# Patient Record
Sex: Male | Born: 1955 | Race: White | Hispanic: No | Marital: Married
Health system: Western US, Academic
[De-identification: ages and names within clinical notes are randomized; demographics above are authoritative.]

---

## 2021-08-16 ENCOUNTER — Ambulatory Visit: Admit: 2021-08-16 | Payer: BLUE CROSS/BLUE SHIELD | Attending: Physician

## 2021-08-16 DIAGNOSIS — S42021A Displaced fracture of shaft of right clavicle, initial encounter for closed fracture: Secondary | ICD-10-CM

## 2021-08-16 NOTE — Patient Instructions (Addendum)
Practice Assistant:  Farris Has  Tel: [phone]  Fax: [phone]  [email or web address]    COLLARBONE/CLAVICLE FRACTURES  (Adapted from [email or web address])    A broken collarbone is also known as a clavicle fracture. This is a very common fracture that occurs in people of all ages.    Anatomy  The collarbone (clavicle) is located between the ribcage (sternum) and the shoulder blade (scapula), and it connects the arm to the body.  The clavicle lies above several important nerves and blood vessels. However, these vital structures are rarely injured when the clavicle breaks, even though the bone ends can shift when they are fractured.      Description  The clavicle is a long bone and most breaks occur in the middle of it. Occasionally, the bone will break where it attaches at the ribcage or shoulder blade.    Cause  Clavicle fractures are often caused by a direct blow to the shoulder. This can happen during a fall onto the shoulder or a car collision. A fall onto an outstretched arm can also cause a clavicle fracture. In babies, these fractures can occur during the passage through the birth canal.  \  Symptoms  Clavicle fractures can be very painful and may make it hard to move your arm. Additional symptoms include:   Sagging shoulder (down and forward)   Inability to lift the arm because of pain   A grinding sensation if an attempt is made to raise the arm   A deformity or "bump" over the break   Bruising, swelling, and/or tenderness over the collarbone    Doctor Examination  During the evaluation, your doctor will ask questions about the injury and how it occurred. After discussing the injury and your symptoms, your doctor will examine your shoulder.    There is usually an obvious deformity, or "bump," at the fracture site. Gentle pressure over the break will bring about pain. Although a fragment of bone rarely breaks through the skin, it may push the skin into a "tent" formation.  Your doctor will carefully examine  your shoulder to make sure that no nerves or blood vessels were damaged.  In order to pinpoint the location and severity of the break, your doctor will order an x-ray. X-rays of the entire shoulder will often be done to check for additional injuries. If other bones are broken, your doctor may order a computed tomography (CT or CAT) scan to see the fractures in better detail.    Nonsurgical Treatment  If the broken ends of the bones have not shifted out of place and line up correctly, you may not need surgery. Broken collarbones can heal without surgery.  Arm Support  A simple arm sling or figure-of-eight wrap is usually used for comfort immediately after the break. These are worn to support your arm and help keep it in position while it heals.  Medication  Pain medication, including acetaminophen, can help relieve pain as the fracture heals.  Physical Therapy  While you are wearing the sling, you will likely lose muscle strength in your shoulder. Once your bone begins to heal, the pain will decrease and your doctor may start gentle shoulder and elbow exercises. These exercises will help prevent stiffness and weakness. More strenuous exercises can gradually be started once the fracture is completely healed.  Doctor Follow-Up  You will need to see your doctor regularly until your fracture heals. He or she will examine you and take x-rays to make sure the bone is healing  in good position. After the bone has healed, you will be able to gradually return to your normal activities.  Complications  The fracture can move out of place before it heals. It is important to follow up with your doctor as scheduled to make sure the bone stays in position.  If the fracture fragments do move out of place and the bones heal in that position, it is called a "malunion." Treatment for this is determined by how far out of place the bones are and how much this affects your arm movement.  A large bump over the fracture site may develop as  the fracture heals. This usually gets smaller over time, but a small bump may remain permanently.    Surgical Treatment  If your bones are out of place by more than 2 cm  (displaced) or broken in to many pieces, your doctor may recommend surgery. Surgery can align the bones exactly and hold them in good position while they heal. This can improve shoulder strength when you have recovered.    Plates and Screws  During this operation, the bone fragments are first repositioned into their normal alignment, and then held in place with special screws and/or by attaching metal plates to the outer surface of the bone.  After surgery, you may notice a small patch of numb skin below the incision. This numbness will become less noticeable with time. Because there is not a lot of fat over the collarbone, you may be able to feel the plate through your skin.  Plates and screws are usually not removed after the bone has healed, unless they are causing discomfort. Problems with the hardware are not common, but sometimes, seatbelts and backpacks can irritate the collarbone area. If this happens, the hardware can be removed after the fracture has healed.  Specific exercises will help restore movement and strengthen your shoulder. Your doctor may provide you with a home therapy plan or suggest that you work with a physical therapist.  Therapy programs typically start with gentle motion exercises. Your doctor will gradually add strengthening exercises to your program as your fracture heals.  Although it is a slow process, following your physical therapy plan is an important factor in returning to all the activities you enjoy.  Surgical Complications  People who use any kind of tobacco product, have diabetes, or are elderly are at a higher risk for complications during and after surgery. They are also more likely to have problems with wound and bone healing. Be sure to talk with your doctor about the risks and benefits of surgery for  your clavicle fracture.  There are risks associated with any surgery, including:   Infection   Bleeding   Pain   Blood clots in your leg   Damage to blood vessels or nerves   Nausea    The risks specific to surgery for collarbone fractures include:   Difficulty with bone healing   Lung injury   Hardware irritation      Outcome  Whether your treatment involves surgery or not, it can take several months for your collarbone to heal. It may take longer in diabetics or people who smoke or chew tobacco.  Most people return to regular activities within 3 months of their injury. Your doctor will tell you when your injury is stable enough to do so. Returning to regular activities or lifting with your arm before your doctor advises may cause your fracture fragments to move or your hardware to break. This  may require you to start your treatment from the beginning.  Once your fracture has completely healed, you can safely return to sports activities.   Preparing for Surgery at [facility] ([facility])                              Your surgeon has recommended that you have an operation.  The following instructions are designed to take you step-by-step through the process and to answer some frequently asked questions.    When am I having surgery and where do I go?  Your surgeon's office will provide you with the location, date, and time for your operation.  If you do not hear from your surgeon's office and want to check on the status of your upcoming procedure, please call the surgeon's office and ask to speak with his/her practice assistant.     Will I meet my anesthesiologist before surgery?  You will not meet the anesthesiologist assigned to take care of you until the day of surgery.  However, you will be assessed by one of [facility] nurse practitioners prior to your operation.  For some patients, this assessment will take place over the phone.  For other patients, an in-person evaluation will be required in our  PREPARE clinic.      What do I need to do now?  You will receive a call from your surgeons office before your surgery to set up either a phone consult or in-person PREPARE clinic appointment.  If the phone consult or in-person PREPARE clinic appointment scheduled does not work, you may call the [facility] at [phone] to reschedule.  If you do not receive a call from the PREPARE clinic, please call your surgeon's office to confirm that your operation has been scheduled and that the referral to PREPARE made.    Do I need to have any tests done prior to surgery?  Your surgeon may require laboratory or other diagnostics tests prior to surgery.  These are printed in your After Visit Summary from your surgery clinic visit, and your surgeon may have also given you printed requisition forms.    If you are seen in-person in the [facility], your tests will be performed during your PREPARE appointment.    If you are evaluated by phone, a member of the PREPARE team will give you explicit instructions on when and where to have these studies done.      For many operations, laboratory tests, EKGs and chest x-rays are not needed.      If you are a Jehovah's Witness, you must notify your surgeon in advance of your procedure. In addition, you MUST discuss your desires regarding transfusion of blood products with your surgeon or the Nurse Practitioner from the [facility].  If you have a bleeding disorder such as von Willebrand's disease or hemophilia, notify your surgeon in advance as special arrangements may need to be made in preparation for your surgery (for example, referral to a hematologist).    Will [facility] review my health information from other places?  In order to plan for a smooth operation and recovery, we frequently need to review records from your other doctors.  If you have had any of the studies listed below, please arrange to have the reports faxed to the [facility] clinic.  Delays in  obtaining these records may lead to a delay in your upcoming procedure.    Please Fax the following  applicable records to [facility] at [phone].  1. Recent notes from your primary care provider  2. Recent blood work (within 6 months)  3. Stress test  4. Echocardiogram (Echo)   5. Electrocardiogram (EKG)  6. Cardiac catheterization   7. Clinic notes from any specialist who has evaluated you in the past 2 years (for example a cardiologist, pulmonologist, hematologist)    Which medications should I stop or start prior to surgery?  Our PREPARE nurse practitioner will give you detailed instructions on how to manage your medications prior to surgery.     Where do I go on the day of surgery?  And when?  [facility] will give you instructions on when to arrive and where to go on the day of surgery.  You will also receive a call from [facility] one to two days before surgery to confirm arrival time.      Where is the [facility] clinic?  [facility] is located inside [facility] at [address] in [place].  Public parking is available in the lot behind the building charged at $5 for three hours paid at a kiosk.  Handicapped parking is $5 flat fee with placard on the dashboard.    Does my surgeon have any special instructions for me?  If your surgeon has specific instructions, they are listed below.
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
## 2021-08-19 ENCOUNTER — Ambulatory Visit: Admit: 2021-08-19 | Discharge: 2021-08-19 | Payer: BLUE CROSS/BLUE SHIELD

## 2021-08-19 DIAGNOSIS — S42021A Displaced fracture of shaft of right clavicle, initial encounter for closed fracture: Secondary | ICD-10-CM

## 2021-08-20 ENCOUNTER — Ambulatory Visit: Admit: 2021-08-21 | Payer: BLUE CROSS/BLUE SHIELD | Attending: Physician Assistant

## 2021-08-20 DIAGNOSIS — S42021A Displaced fracture of shaft of right clavicle, initial encounter for closed fracture: Secondary | ICD-10-CM

## 2021-08-20 LAB — COVID-19 RNA, RT-PCR/NUCLEIC ACID AMPLIFICATION: COVID-19 RNA, RT-PCR/Nucleic Acid Amplification: NOT DETECTED

## 2021-08-20 MED ORDER — NALOXONE 4 MG/ACTUATION NASAL SPRAY
4 mg/actuation | Freq: Once | NASAL | 0 refills | Status: DC | PRN
Start: 2021-08-20 — End: 2022-05-19

## 2021-08-20 MED ORDER — OXYCODONE-ACETAMINOPHEN 5 MG-325 MG TABLET
5-325 | ORAL_TABLET | ORAL | 0 refills | 30.00000 days | Status: AC | PRN
Start: 2021-08-20 — End: 2021-10-25

## 2021-08-20 MED ORDER — DOCUSATE SODIUM 250 MG CAPSULE
250 mg | ORAL_CAPSULE | Freq: Two times a day (BID) | ORAL | 2 refills | Status: AC | PRN
Start: 2021-08-20 — End: 2021-10-25

## 2021-08-20 MED ORDER — IBUPROFEN 600 MG TABLET
600 | ORAL_TABLET | Freq: Four times a day (QID) | ORAL | 1 refills | Status: DC | PRN
Start: 2021-08-20 — End: 2021-10-25

## 2021-08-20 MED ORDER — TRULICITY 1.5 MG/0.5 ML SUBCUTANEOUS PEN INJECTOR
1.5 | SUBCUTANEOUS | 2.00 refills | Status: DC
Start: 2021-08-20 — End: 2023-04-24

## 2021-08-20 MED ORDER — ONDANSETRON HCL 4 MG TABLET
4 | ORAL_TABLET | Freq: Three times a day (TID) | ORAL | 2 refills | Status: DC | PRN
Start: 2021-08-20 — End: 2021-10-25

## 2021-08-20 NOTE — H&P (View-Only) (Signed)
[facility]  Anesthesia Preprocedure Evaluation    Procedure Information     Case: [id number] Date/Time: 08/22/21 0730    Procedure: CLAVICLE OPEN REDUCTION INTERNAL FIXATION (ORIF) (Right )    Diagnosis: Displaced fracture of shaft of right clavicle, initial encounter for closed fracture [S42.021A]    Pre-op diagnosis: Displaced fracture of shaft of right clavicle, initial encounter for closed fracture [S42.021A]    Location: [place] [facility] OR 04 /  [facility]    Surgeons: Eyvonne Mechanic, MD          Precautions          None          Relevant Problems   No relevant active problems         Anesthesia Encounter History        CC/HPI/Past Medical History Summary: Alvin Barber is a 65 y.o. male     #Insulin dependent diabetic - on insulin, metformin; last dose of lantis last night, took 1 u Humalog this AM for glucose 163 at home     (Please refer to APeX Allergies, Problems, Past Medical History, Past Surgical History, Social History, and Family History activities, Results for current data from these respective sections of the chart; these sections of the chart are also summarized in reports, including the Patient Summary Extracts found in Chart Review)        Summary of Prior Anesthetics: Previous anesthetic without AAC          Review of Systems Functional Status: Participate in moderate recreation activies, such as golfs, bowling, dancing, or throwing a ball (6.00 METs)   Constitutional: Negative for chills, diaphoresis, fatigue, fever and unexpected weight change.   Airway: Positive for snoring. Negative for limited neck movement, difficulty opening mouth, loose teeth, dental hardware, TMJ problem, witnessed apnea and daytime somnolence       Denies OSA  HENT: Negative for congestion, sinus pressure and trouble swallowing.   Eyes: Positive for visual disturbance.        Glasses   Respiratory: Negative for chest tightness, cough, Recent URI Symptoms, shortness of breath and wheezing.          Denies asthma   Cardiovascular: Negative for orthopnea, palpitations and chest pain.        Denies cv dz   Gastrointestinal: Positive for GERD Symptoms (controlled with prilosec and diet). Negative for abdominal distention, abdominal pain, anal bleeding, nausea and vomiting.        Deneis liver dz   Genitourinary: Negative for difficulty urinating, dysuria and hematuria.        Denies kidney dz   Musculoskeletal: Positive for arthralgias. Negative for neck pain and back pain.        Able to lie flat   Skin: Negative for rash and wound.   Neurological: Negative for headaches, syncope, seizures and dizziness.   Hematological: Does not bruise/bleed easily.        Latent adult onset diabetes. Uses lantus 22 units at night    FBG usually 110. No recent highs or lows. humalog on SS. avg about 10-12 units a day. Recent A1c 6.7   Psychiatric/Behavioral/Developmental: Negative.      Ht 188 cm (6\' 2" )   Wt 80.7 kg (178 lb)   BMI 22.85 kg/m     Physical Exam    Airway:   Modified Mallampati score: I. Thyromental distance: > 6.5 cm. Mouth opening: good. Neck range of motion: full.   Constitutional:  Appearance: He is well-developed and well-nourished.   Cardiovascular:      Rate and Rhythm: Normal rate and regular rhythm.   Pulmonary:      Effort: Pulmonary effort is normal.   Neurological:   Level of consciousness: alert.     Mental Status: He is oriented to person, place, and time.         Prepare (Pre-Operative Clinic) Assessment/Plan/Narrative  Prepare Clinic consult type: Telephone  CHG needed for pre-surgical prep, pt given instructions via AVS.  NPO instructions discussed and understood by patient.     AVS discussed, understood by pt., and sent via my chart    Instructed pt to not take Ibuprofen, ASA, MVI or other herbal supplements. Tylenol okay if needed.    Hold metformin DOS      On the day before surgery/procedure    Decrease glargine (Lantus) to 80% of the usual dose, to a temporary dose of 18 units on the  night before your surgery/procedure.  On the day of surgery/procedure    1. Check your glucose every 4 hours while you are awake.  2. Based on those glucose levels, give yourself Rapid - lispro (Humalog, Admelog, Lyumjev) Insulin starting in the morning.     Total Daily Insulin Dose (MDI)  Glucose  You currently take a total of 30 Units/day or less  You currently take more than a total of 30 Units/day  160-200   1 Units  2 Units  201-250   2 Units  3 Units  >250   3 Units  4 Units    You may notice that these short acting insulin correction doses may be different from what you normally use. In the short time around surgery, it is important to avoid potentially dangerous low glucoses (and avoid high glucoses that may increase risk of infection.)  Thus a glucose range a bit higher than you may be used to  on a daily basis may be safer and more appropriate.  You may be given separate instructions that you should not eat or drink anything after midnight, the night before your procedure/surgery. If you have a blood sugar below 70 after the time you are no longer supposed to take anything by mouth, you may safely treat the low sugar with either glucagon (injection or intranasal), Glucogel, or one tablespoon of granulated sugar placed under the tongue or in the cheek area. Up until 4 hours before your surgery/procedure, you may treat your low blood sugar with 4 ounces (1/2 cup) of clear juice (no pulp), or 4 ounces (1/2 cup) of sugar containing clear soda (not a diet drink)        These are suggestions. You must take into account your current clinical condition. If you have any questions, please contact your primary care physician, endocrinologist or surgeon. If you have a medical emergency, please go to the nearest emergency room or call 911 for assistance.      Obstructive Sleep Apnea Screening  STOPBANG Score:      S Do you Snore loudly (louder than talking or loud  enough to be heard through closed doors)? No    T Do  you often feel Tired, fatigued, or sleepy during daytime? No    O Has anyone Observed you stop breathing during  your sleep? No    P Do you have or are you being treated for high blood Pressure? No    B BMI more than 35 KG/m^2? No    A  Age over 61 years old? Yes    N Neck circumference > 16 inches (40cm)?     G Gender: Male? Yes    STOPBANG Total Score 2         Risk Level (based only on STOPBANG) Low - Incomplete       CPAP/BiPAP prescribed - No.              Anesthesia Assessment and Plan  Day Of Surgery Provider Chart Review:  NPO status verified  Medications reviewed  Allergies reviewed  Problem list reviewed  Anesthesia history reviewed  Pertinent labs reviewed  Consults reviewed    ASA 2       Anesthesia Plan  Anesthesia Type: general and regionalRegional Type: interscalene   Induction Technique: IntraVenous  Invasive Monitors/Vascular Access: none  Airway Plan: supraglottic airway  Possible Advanced Airway Techniques: None  Other Techniques: None  Planned Recovery Location: PACU    Blood Product PreparationBlood Products Plan: N/A, minimal risk    Anesthesia Potential Complication Discussion  There is the possibility of rare but serious complications.    Informed Consent for Anesthesia  Consent obtained from patient    Risks, benefits and alternatives including those of invasive monitoring discussed. Increased risks (as above) discussed.  Questions invited and all answered.  Interpreter: N/A - patient/guardian's preferred language is [place].  Regional Anesthesia Consent:  As part of obtaining consent for regional anesthesia the following was discussed with the patient:  1. The role of the nerve block within the overall anesthetic plan and alternative analgesic approaches  2. How the block will be performed  3. The risk of bleeding and infection  4. The risk of nerve injury within a spectrum of mild/temporary to permanent/sever neurological deficit  5. The likely effects of the nerve block, specifically    A.  Extent of sensory block    B. Potential motor block    C. Potential side effects    D. Duration of action    Consent granted for anesthetic plan    Quality Measure Documentation   Opioid Therapy Planned? Yes    (See Anesthesia Record for attending attestation)    [Please note, smart link data included in this note may not reflect changes since note creation. Please see appropriate section of APeX for up-to-the minute information.]

## 2021-08-20 NOTE — Patient Instructions (Signed)
We want your upcoming surgical visit to be as safe and comfortable as possible. The following instructions are designed to prepare you for your surgical hospitalization.  Please read and follow all instructions carefully.    Procedure Location  Your procedure is scheduled to take place at [facility] [address] 2nd Floor. Report to 2nd Floor Front Desk at assigned ARRIVAL time noted below. Phone [phone]    Procedure Date and Time  Please arrive on 08/22/2021 .       Contact your surgeon's office if you have not received your time of arrival for the day surgery    If for any reason your surgery start time is changed, you will receive a call from your surgeons office.  Should you have any questions regarding the date or time of arrival, please call your surgeons office and ask to speak with his/her practice assistant.    Preparing for Surgery  What can I eat or drink on the day of surgery?  Please do not have anything to eat or drink except clear liquids after midnight the evening before your surgery (including gum, candy or mints).  You may have clear liquids on the day of surgery up to 2 hours prior to arrival.  Clear liquids include:  Non-pulp, clear apple juice.  Tea with sugar or sweetener (NO milk, cream, or milk substitute)  Gatorade  Water  If you drink anything other than clear liquids on the day of surgery, or if you have ANYTHING to drink in the two hours prior to hospital arrival, then your doctors will cancel your surgery for your safety.  If you have been instructed to take any medications the day of surgery, take them with a small sip of water.    If your surgeon has given you additional instructions about what to eat or drink before the day of surgery, please follow those instructions as well.    Which medications should I take on the day of surgery?     Medication List            Accurate as of August 20, 2021  9:40 AM. Always use your most recent med list.                 Medication Information        Take Morning of Surgery Special Instructions Other   HYDROcodone-acetaminophen 7.5-325 mg tablet  Take 1 tablet by mouth every 6 (six) hours as needed for Pain  Commonly known as: NORCO              insulin lispro 100 unit/mL injection pen  Humalog KwikPen (U-100) Insulin 100 unit/mL subcutaneous   INJECT 12 UNITS SUBCUTANEOUS EVERY DAY  Commonly known as: HumaLOG              LANTUS U-100 INSULIN 100 unit/mL injection  Lantus 100 UNIT/ML Subcutaneous Solution     Active  Generic drug: insulin glargine              metFORMIN 1,000 mg tablet  Take 1,000 mg by mouth 2 (two) times daily with meals  Commonly known as: GLUCOPHAGE     NO         omeprazole 40 mg capsule  GENERIC FOR PRILOSEC. TAKE 1 CAPSULE(40 MG) BY MOUTH DAILY  Commonly known as: PRILOSEC     YES         TRULICITY 1.5 mg/0.5 mL Peninjctr  Trulicity 1.5 mg/0.5 mL subcutaneous pen injector  ADMINISTER 1.5 MG UNDER THE SKIN EVERY WEEK  Generic drug: dulaglutide   NO                      n the day before surgery/procedure    Decrease glargine (Lantus) to 80% of the usual dose, to a temporary dose of 18 units on the night before your surgery/procedure.  On the day of surgery/procedure    1. Check your glucose every 4 hours while you are awake.  2. Based on those glucose levels, give yourself Rapid - lispro (Humalog, Admelog, Lyumjev) Insulin starting in the morning.     Total Daily Insulin Dose (MDI)  Glucose  You currently take a total of 30 Units/day or less  You currently take more than a total of 30 Units/day  160-200   1 Units  2 Units  201-250   2 Units  3 Units  >250   3 Units  4 Units    You may notice that these short acting insulin correction doses may be different from what you normally use. In the short time around surgery, it is important to avoid potentially dangerous low glucoses (and avoid high glucoses that may increase risk of infection.)  Thus a glucose range a bit higher than you may be used to  on a  daily basis may be safer and more appropriate.  You may be given separate instructions that you should not eat or drink anything after midnight, the night before your procedure/surgery. If you have a blood sugar below 70 after the time you are no longer supposed to take anything by mouth, you may safely treat the low sugar with either glucagon (injection or intranasal), Glucogel, or one tablespoon of granulated sugar placed under the tongue or in the cheek area. Up until 4 hours before your surgery/procedure, you may treat your low blood sugar with 4 ounces (1/2 cup) of clear juice (no pulp), or 4 ounces (1/2 cup) of sugar containing clear soda (not a diet drink)        These are suggestions. You must take into account your current clinical condition. If you have any questions, please contact your primary care physician, endocrinologist or surgeon. If you have a medical emergency, please go to the nearest emergency room or call 911 for assistance.      Preparing to Come to the [facility]  Showering Instructions:  Shower the morning of surgery. After showering, do not apply lotion, cream, powder, deodorant, or hair conditioner.   Do not shave or remove body hair. Shaving your face is generally fine. If you are having head surgery, however, ask your doctor whether you can shave.      Wear casual, loose fitting, comfortable clothing and leave all valuables, including jewelry, and large sums of cash at home.  Leave your valuables at home. Belongings that remain with you are your responsibility. [facility] is not liable for loss or damage. If valuables are brought to the hospital, they will be identified and recorded by our admitting team. All jewelry must be removed and left at home. If rings cannot be removed, we encourage you to have them removed by a jeweler prior to surgery to avoid damage or loss of ring.  [facility] is not responsible of lose or damaged jewelry. This policy protects the patient and prevents the items from being  lost or damaged.   Leave contact lenses at home. Wear your eyeglasses and bring a case.  If you develop  any illness prior to surgery (fever, cough, sore throat, cold, flu, infection), OR START A NEW MEDICATION, please call your surgeon and the [facility] at [phone]  If you are spending the night you may bring toiletries and sleeping clothes if you desire; otherwise the hospital will provide them for you.   DO NOT bring your medications with you to the hospital unless you were specifically instructed to do so.  DO bring a list of your medications including dose(s) and when you take them.  DO bring TWO forms of ID - including one ID with a photo.    Leaving the Hospital  Please ask your surgeon about your anticipated length of stay.  Please make sure your ride is available to pick you up by 12N on day of discharge  It is recommended that all patients have a responsible person at home the first night after discharge from the hospital.  ALL patients, including same day surgery patients, must arrange for an adult to drive/escort them home upon discharge.  Patients going home the same day of surgery will have their procedure cancelled if these arrangements are not made ahead of time.    Family and Friends  Friends and family may wait in the assigned waiting areas.  Patient care coordinators are available in these patient waiting areas to provide updates regarding patient progress; hospital room assignments; and discharge planning (for same day surgery).    [facility] [address] 2nd Floor Waiting Room    Guided Imagery  Research has shown that listening to guided imagery is helpful for many health conditions.  [facility] offers these sessions to listen to at the following website: [email or web address]     Please consider guided imagery to prepare for surgery and for coping with stress, sleep and pain.      COVID Screening  On-Demand  Screening for COVID is required for all patients and visitors arriving to a [facility] facility. To expedite the screening process, you are welcome to utilize the [facility] screening on-demand tool. Please follow the link from your smartphone or scan the QR code to open the screening tool on the day of your arrival and answer the screening questions prior to your arrival.     On-demand COVID screening: [email or web address]               Temporary Visitor Restrictions During the COVID-19 Pandemic:    Our Visitor Policies are guided by COVID-19 prevalence rate in the [employer] and in the [facility]. We use three different levels of visitation. Before coming to the hospital for your surgery/procedure, please review the [facility] COVID Visitor Restrictions: [email or web address].[email or web address]  Visitor restrictions can vary by location, and may not be the same in Perioperative Care areas as they are in other parts of the hospital  If you are admitted to the hospital overnight visitors are approved by the inpatient Unit Director
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
## 2021-08-20 NOTE — Anesthesia Pre-Procedure Evaluation (Addendum)
[facility]  Anesthesia Preprocedure Evaluation    Procedure Information     Case: [id number] Date/Time: 08/22/21 0730    Procedure: CLAVICLE OPEN REDUCTION INTERNAL FIXATION (ORIF) (Right )    Diagnosis: Displaced fracture of shaft of right clavicle, initial encounter for closed fracture [S42.021A]    Pre-op diagnosis: Displaced fracture of shaft of right clavicle, initial encounter for closed fracture [S42.021A]    Location: [facility] / [facility] [place]    Surgeons: Elly Susannah Laroque, MD          Precautions          None          Relevant Problems   No relevant active problems         Anesthesia Encounter History        CC/HPI/Past Medical History Summary: Alvin Barber is a 65 y.o. male     #Insulin dependent diabetic - on insulin, metformin; last dose of lantis last night, took 1 u Humalog this AM for glucose 163 at home     (Please refer to APeX Allergies, Problems, Past Medical History, Past Surgical History, Social History, and Family History activities, Results for current data from these respective sections of the chart; these sections of the chart are also summarized in reports, including the Patient Summary Extracts found in Chart Review)        Summary of Prior Anesthetics: Previous anesthetic without AAC          Review of Systems Functional Status: Participate in moderate recreation activies, such as golfs, bowling, dancing, or throwing a ball (6.00 METs)   Constitutional: Negative for chills, diaphoresis, fatigue, fever and unexpected weight change.   Airway: Positive for snoring. Negative for limited neck movement, difficulty opening mouth, loose teeth, dental hardware, TMJ problem, witnessed apnea and daytime somnolence       Denies OSA  HENT: Negative for congestion, sinus pressure and trouble swallowing.   Eyes: Positive for visual disturbance.        Glasses   Respiratory: Negative for chest tightness, cough, Recent URI Symptoms, shortness of breath and wheezing.          Denies asthma   Cardiovascular: Negative for orthopnea, palpitations and chest pain.        Denies cv dz   Gastrointestinal: Positive for GERD Symptoms (controlled with prilosec and diet). Negative for abdominal distention, abdominal pain, anal bleeding, nausea and vomiting.        Deneis liver dz   Genitourinary: Negative for difficulty urinating, dysuria and hematuria.        Denies kidney dz   Musculoskeletal: Positive for arthralgias. Negative for neck pain and back pain.        Able to lie flat   Skin: Negative for rash and wound.   Neurological: Negative for headaches, syncope, seizures and dizziness.   Hematological: Does not bruise/bleed easily.        Latent adult onset diabetes. Uses lantus 22 units at night    FBG usually 110. No recent highs or lows. humalog on SS. avg about 10-12 units a day. Recent A1c 6.7   Psychiatric/Behavioral/Developmental: Negative.      Ht 188 cm (6' 2")   Wt 80.7 kg (178 lb)   BMI 22.85 kg/m     Physical Exam    Airway:   Modified Mallampati score: I. Thyromental distance: > 6.5 cm. Mouth opening: good. Neck range of motion: full.   Constitutional:         Appearance: He is well-developed and well-nourished.   Cardiovascular:      Rate and Rhythm: Normal rate and regular rhythm.   Pulmonary:      Effort: Pulmonary effort is normal.   Neurological:   Level of consciousness: alert.     Mental Status: He is oriented to person, place, and time.         Prepare (Pre-Operative Clinic) Assessment/Plan/Narrative  Prepare Clinic consult type: Telephone  CHG needed for pre-surgical prep, pt given instructions via AVS.  NPO instructions discussed and understood by patient.     AVS discussed, understood by pt., and sent via my chart    Instructed pt to not take Ibuprofen, ASA, MVI or other herbal supplements. Tylenol okay if needed.    Hold metformin DOS      On the day before surgery/procedure    Decrease glargine (Lantus) to 80% of the usual dose, to a temporary dose of 18 units on the  night before your surgery/procedure.  On the day of surgery/procedure    1. Check your glucose every 4 hours while you are awake.  2. Based on those glucose levels, give yourself Rapid - lispro (Humalog, Admelog, Lyumjev) Insulin starting in the morning.     Total Daily Insulin Dose (MDI)  Glucose  You currently take a total of 30 Units/day or less  You currently take more than a total of 30 Units/day  160-200   1 Units  2 Units  201-250   2 Units  3 Units  >250   3 Units  4 Units    You may notice that these short acting insulin correction doses may be different from what you normally use. In the short time around surgery, it is important to avoid potentially dangerous low glucoses (and avoid high glucoses that may increase risk of infection.)  Thus a glucose range a bit higher than you may be used to  on a daily basis may be safer and more appropriate.  You may be given separate instructions that you should not eat or drink anything after midnight, the night before your procedure/surgery. If you have a blood sugar below 70 after the time you are no longer supposed to take anything by mouth, you may safely treat the low sugar with either glucagon (injection or intranasal), Glucogel, or one tablespoon of granulated sugar placed under the tongue or in the cheek area. Up until 4 hours before your surgery/procedure, you may treat your low blood sugar with 4 ounces (1/2 cup) of clear juice (no pulp), or 4 ounces (1/2 cup) of sugar containing clear soda (not a diet drink)        These are suggestions. You must take into account your current clinical condition. If you have any questions, please contact your primary care physician, endocrinologist or surgeon. If you have a medical emergency, please go to the nearest emergency room or call 911 for assistance.      Obstructive Sleep Apnea Screening  STOPBANG Score:      S Do you Snore loudly (louder than talking or loud  enough to be heard through closed doors)? No    T Do  you often feel Tired, fatigued, or sleepy during daytime? No    O Has anyone Observed you stop breathing during  your sleep? No    P Do you have or are you being treated for high blood Pressure? No    B BMI more than 35 KG/m^2? No    A  Age over 50 years old? Yes    N Neck circumference > 16 inches (40cm)?     G Gender: Male? Yes    STOPBANG Total Score 2         Risk Level (based only on STOPBANG) Low - Incomplete       CPAP/BiPAP prescribed - No.              Anesthesia Assessment and Plan  Day Of Surgery Provider Chart Review:  NPO status verified  Medications reviewed  Allergies reviewed  Problem list reviewed  Anesthesia history reviewed  Pertinent labs reviewed  Consults reviewed    ASA 2       Anesthesia Plan  Anesthesia Type: general and regionalRegional Type: interscalene   Induction Technique: IntraVenous  Invasive Monitors/Vascular Access: none  Airway Plan: supraglottic airway  Possible Advanced Airway Techniques: None  Other Techniques: None  Planned Recovery Location: PACU    Blood Product PreparationBlood Products Plan: N/A, minimal risk    Anesthesia Potential Complication Discussion  There is the possibility of rare but serious complications.    Informed Consent for Anesthesia  Consent obtained from patient    Risks, benefits and alternatives including those of invasive monitoring discussed. Increased risks (as above) discussed.  Questions invited and all answered.  Interpreter: N/A - patient/guardian's preferred language is English.  Regional Anesthesia Consent:  As part of obtaining consent for regional anesthesia the following was discussed with the patient:  1. The role of the nerve block within the overall anesthetic plan and alternative analgesic approaches  2. How the block will be performed  3. The risk of bleeding and infection  4. The risk of nerve injury within a spectrum of mild/temporary to permanent/sever neurological deficit  5. The likely effects of the nerve block, specifically    A.  Extent of sensory block    B. Potential motor block    C. Potential side effects    D. Duration of action    Consent granted for anesthetic plan    Quality Measure Documentation   Opioid Therapy Planned? Yes    (See Anesthesia Record for attending attestation)    [Please note, smart link data included in this note may not reflect changes since note creation. Please see appropriate section of APeX for up-to-the minute information.]

## 2021-08-20 NOTE — Patient Instructions (Signed)
Alvin Barber,    It was a pleasure seeing you today for your pre-operative visit.     As discussed, the following medications were sent to your pharmacy for you to use after surgery to manage pain and other symptoms.     - Narcotic pain medication: Percocet (Oxycodone and Acetaminophen/Tylenol) take 1-2 tablets every 4 hours as needed for pain.  - Non-steroidal anti-inflammatory pain medication: Ibuprofen 600mg  tablets- take 1 tablet three times daily with food for pain and swelling.  - Stool Softener for constipation (which can occur due to taking narcotic pain medication): Colace/Docusate 250mg  capsule- take twice daily as needed for constipation.   - Nausea medication (Zofran/Ondansetron) 4mg  tablet - take 2 tablets twice daily as needed for nausea.    The worst pain typically occurs the first 24-48 hours after the nerve block wears off after surgery. After this, pain generally begins to improve. Most patients are able to decrease and discontinue the narcotic pain medication within a few days or week of surgery.   Ice, Ibuprofen and over-the-counter extra strength Tylenol are also very helpful to help manage pain. Since the narcotic pain medication also has Tylenol, avoid over-the-counter Tylenol until you have begun tapering off the narcotic medication. Total Tylenol in a 24 hour period should be less than 3000mg .   It is okay to take Tylenol and Ibuprofen at the same time.     Keep the dressing over the surgical site and the incisions clean and dry until your post-op visit. Avoid submerging the incisions/shoulder in a bath, pool, hot tub/jacuzzi for 1 month as a precaution for infection.     A Physical Therapy (PT) Referral will be sent to you. Please begin considering where you would like to attend PT and schedule a visit after your first post-op appointment.     Please send a MyChart message if you have any questions or concerns.   If you have an urgent medical question or request during the evenings or  weekends, then please call [phone] and request to speak with the On[facility] provider or go to the nearest Urgent Care or Emergency Room.    , PA-C  Physician Assistant for Dr.    Visit for rehab protocols and post-operative exercises

## 2021-08-22 ENCOUNTER — Ambulatory Visit: Admit: 2021-08-23 | Discharge: 2021-08-27 | Payer: BLUE CROSS/BLUE SHIELD | Attending: Athletic Trainer

## 2021-08-22 DIAGNOSIS — S42021A Displaced fracture of shaft of right clavicle, initial encounter for closed fracture: Secondary | ICD-10-CM

## 2021-08-22 LAB — POCT GLUCOSE
Glucose, Glucometer: 147 mg/dL (ref 70–199)
Glucose, Glucometer: 106 mg/dL (ref 70–199)

## 2021-08-22 MED ORDER — FENTANYL (PF) 50 MCG/ML INJECTION SOLUTION
50 | INTRAMUSCULAR | Status: DC | PRN
Start: 2021-08-22 — End: 2021-08-22
  Administered 2021-08-22: 16:00:00 via INTRAVENOUS

## 2021-08-22 MED ORDER — MEPERIDINE (PF) 25 MG/ML INJECTION SOLUTION
25 | INTRAMUSCULAR | Status: DC | PRN
Start: 2021-08-22 — End: 2021-08-22

## 2021-08-22 MED ORDER — MIDAZOLAM 1 MG/ML INJECTION SOLUTION
1 | INTRAMUSCULAR | Status: DC | PRN
Start: 2021-08-22 — End: 2021-08-22
  Administered 2021-08-22: 14:00:00 2 via INTRAVENOUS

## 2021-08-22 MED ORDER — OXYCODONE 5 MG TABLET
5 | Freq: Once | ORAL | Status: DC | PRN
Start: 2021-08-22 — End: 2021-08-22

## 2021-08-22 MED ORDER — BUPIVACAINE-EPINEPHRINE (PF) 0.5 %-1:200,000 INJECTION SOLUTION
0.5 | INTRAMUSCULAR | Status: AC
Start: 2021-08-22 — End: ?

## 2021-08-22 MED ORDER — ROPIVACAINE (PF) 5 MG/ML (0.5 %) INJECTION SOLUTION
5 | INTRAMUSCULAR | Status: DC | PRN
Start: 2021-08-22 — End: 2021-08-22
  Administered 2021-08-22: 14:00:00 18

## 2021-08-22 MED ORDER — ONDANSETRON HCL (PF) 4 MG/2 ML INJECTION SOLUTION
4 | INTRAMUSCULAR | Status: DC | PRN
Start: 2021-08-22 — End: 2021-08-22
  Administered 2021-08-22: 16:00:00 4 via INTRAVENOUS

## 2021-08-22 MED ORDER — MENTHOL 5.8 MG LOZENGES
5.8 | Status: DC | PRN
Start: 2021-08-22 — End: 2021-08-22

## 2021-08-22 MED ORDER — CEFAZOLIN 1 GRAM SOLUTION FOR INJECTION
1 | INTRAMUSCULAR | Status: AC
Start: 2021-08-22 — End: ?

## 2021-08-22 MED ORDER — CEFAZOLIN 1 GRAM SOLUTION FOR INJECTION
1 gram | INTRAMUSCULAR | Status: DC | PRN
Start: 2021-08-22 — End: 2021-08-22
  Administered 2021-08-22: 15:00:00

## 2021-08-22 MED ORDER — KETOROLAC 30 MG/ML (1 ML) INJECTION SOLUTION
30 | INTRAMUSCULAR | Status: AC
Start: 2021-08-22 — End: ?

## 2021-08-22 MED ORDER — LIDOCAINE 0.9 % BUFFERED WITH 8.4 % SODIUM BICARB (1 ML) INJ SYRINGE
0.9 | Freq: Once | INTRAMUSCULAR | Status: DC
Start: 2021-08-22 — End: 2021-08-22

## 2021-08-22 MED ORDER — CEFAZOLIN 1 GRAM SOLUTION FOR INJECTION
1 | INTRAMUSCULAR | Status: DC | PRN
Start: 2021-08-22 — End: 2021-08-22
  Administered 2021-08-22: 15:00:00 2000 via INTRAVENOUS

## 2021-08-22 MED ORDER — HALOPERIDOL LACTATE 5 MG/ML INJECTION SOLUTION
5 | Freq: Four times a day (QID) | INTRAMUSCULAR | Status: DC | PRN
Start: 2021-08-22 — End: 2021-08-22

## 2021-08-22 MED ORDER — MIDAZOLAM 1 MG/ML INJECTION SOLUTION
1 | INTRAMUSCULAR | Status: AC
Start: 2021-08-22 — End: ?

## 2021-08-22 MED ORDER — LIDOCAINE (PF) 100 MG/5 ML (2 %) INTRAVENOUS SYRINGE
100 | INTRAVENOUS | Status: DC | PRN
Start: 2021-08-22 — End: 2021-08-22
  Administered 2021-08-22: 15:00:00 60 via INTRAVENOUS

## 2021-08-22 MED ORDER — PROPOFOL 10 MG/ML INTRAVENOUS EMULSION
10 | INTRAVENOUS | Status: DC | PRN
Start: 2021-08-22 — End: 2021-08-22
  Administered 2021-08-22: 15:00:00 150 via INTRAVENOUS
  Administered 2021-08-22: 15:00:00 25 via INTRAVENOUS

## 2021-08-22 MED ORDER — ONDANSETRON HCL (PF) 4 MG/2 ML INJECTION SOLUTION
4 | INTRAMUSCULAR | Status: AC
Start: 2021-08-22 — End: ?

## 2021-08-22 MED ORDER — SODIUM CHLORIDE 0.9 % INTRAVENOUS SOLUTION
0.9 % | INTRAVENOUS | Status: DC | PRN
Start: 2021-08-22 — End: 2021-08-22
  Administered 2021-08-22: 15:00:00 via INTRAVENOUS

## 2021-08-22 MED ORDER — HYDROMORPHONE (PF) 0.5 MG/0.5 ML INJECTION SYRINGE
0.5 | INTRAMUSCULAR | Status: DC | PRN
Start: 2021-08-22 — End: 2021-08-22

## 2021-08-22 MED ORDER — LIDOCAINE (PF) 20 MG/ML (2 %) INJECTION SOLUTION
20 | INTRAMUSCULAR | Status: AC
Start: 2021-08-22 — End: ?

## 2021-08-22 MED ORDER — LACTATED RINGERS INTRAVENOUS SOLUTION
INTRAVENOUS | Status: DC
Start: 2021-08-22 — End: 2021-08-22
  Administered 2021-08-22: 14:00:00 via INTRAVENOUS

## 2021-08-22 MED ORDER — SODIUM CHLORIDE 0.9 % INJECTION SOLUTION
0.9 | INTRAMUSCULAR | Status: AC
Start: 2021-08-22 — End: ?

## 2021-08-22 MED ORDER — PHENYLEPHRINE 1 MG/10 ML (100 MCG/ML) IN 0.9 % SOD.CHLORIDE IV SYRINGE
1 | INTRAVENOUS | Status: AC
Start: 2021-08-22 — End: ?

## 2021-08-22 MED ORDER — FENTANYL (PF) 50 MCG/ML INJECTION SOLUTION
50 | INTRAMUSCULAR | Status: AC
Start: 2021-08-22 — End: ?

## 2021-08-22 MED ORDER — BUPIVACAINE-EPINEPHRINE (PF) 0.5 %-1:200,000 INJECTION SOLUTION
0.5 | INTRAMUSCULAR | Status: DC | PRN
Start: 2021-08-22 — End: 2021-08-22
  Administered 2021-08-22: 15:00:00 5 via INTRAMUSCULAR

## 2021-08-22 MED FILL — ONDANSETRON HCL (PF) 4 MG/2 ML INJECTION SOLUTION: 4 mg/2 mL | INTRAMUSCULAR | Qty: 2

## 2021-08-22 MED FILL — PHENYLEPHRINE 1 MG/10 ML (100 MCG/ML) IN 0.9 % SOD.CHLORIDE IV SYRINGE: 1 mg/0 mL (00 mcg/mL) | INTRAVENOUS | Qty: 10

## 2021-08-22 MED FILL — MIDAZOLAM 1 MG/ML INJECTION SOLUTION: 1 mg/mL | INTRAMUSCULAR | Qty: 2

## 2021-08-22 MED FILL — FENTANYL (PF) 50 MCG/ML INJECTION SOLUTION: 50 mcg/mL | INTRAMUSCULAR | Qty: 2

## 2021-08-22 MED FILL — SENSORCAINE-MPF/EPINEPHRINE 0.5 %-1:200,000 INJECTION SOLUTION: 0.5 %-1:200,000 | INTRAMUSCULAR | Qty: 30

## 2021-08-22 MED FILL — KETOROLAC 30 MG/ML (1 ML) INJECTION SOLUTION: 30 mg/mL (1 mL) | INTRAMUSCULAR | Qty: 1

## 2021-08-22 MED FILL — SODIUM CHLORIDE 0.9 % INJECTION SOLUTION: 0.9 % | INTRAMUSCULAR | Qty: 80

## 2021-08-22 MED FILL — CEFAZOLIN 1 GRAM SOLUTION FOR INJECTION: 1 gram | INTRAMUSCULAR | Qty: 2000

## 2021-08-22 MED FILL — CEFAZOLIN 1 GRAM SOLUTION FOR INJECTION: 1 gram | INTRAMUSCULAR | Qty: 1000

## 2021-08-22 MED FILL — XYLOCAINE-MPF 20 MG/ML (2 %) INJECTION SOLUTION: 20 mg/mL (2 %) | INTRAMUSCULAR | Qty: 5

## 2021-08-22 NOTE — Interval H&P Note (Signed)
Procedure(s) (LRB):  CLAVICLE OPEN REDUCTION INTERNAL FIXATION (ORIF) (Right)      ATTENDING SURGEON PREOPERATIVE NOTE  Alvin Barber is a 65 y.o. male with the following pre-op diagnosis: Pre-Op Diagnosis Codes:     * Displaced fracture of shaft of right clavicle, initial encounter for closed fracture [S42.021A]    Surgeon(s) and Role:     * Eyvonne Mechanic, MD - Primary  Planned procedure: Right clavicle open reduction internal fixation  Indications:  Right clavicle fracture     INTERVAL HISTORY AND PHYSICAL  The patient's history and physical were reviewed.  I have performed an appropriate, condition-specific physical examination today, and the indication still exists for surgery.     There are no changes in the patients health history, review of systems, or physical findings since the previously recorded history and physical.    Physical exam done today:   RUE  Appearance swelling about the mid clavicle, non-threatened skin  Fires epl/fpl/fdp/io  SILT m/u/r/a  2+ radial pulse, fingers wwp       DOCUMENTATION OF INFORMED CONSENT  I have discussed the risks, benefits, and alternatives of the procedure with the patient and/or the patient's medical decision-maker.  This discussion included, but was not limited to, the risk of bleeding, infection, damage to anatomical structures, need for reoperation, or even death.  The patient and/or the patient's medical decision maker understands, has had all of his/her questions answered, and desires to proceed.  Informed consent obtained.    OVERLAPPING OPERATIONS  If I am attending more than one overlapping operation, this surgeon is my backup: N/A

## 2021-08-22 NOTE — Progress Notes (Signed)
Reason for Visit      Shoulder Pain    Comments  Rt Shoulder    (S42.021A) Displaced fracture of shaft of right clavicle, initial encounter for closed fracture  (primary encounter diagnosis)  There were no vitals filed for this visit.    Patient ID: Alvin Barber is a 65 y.o. male  Subjective:      Alvin Barber [facility] for fitting of right shoulder Ox. Patient has history of fracture of right clavicle.  Pt is willing to use Ox. Pt has normal mood and affect per OR staff.      Objective:      None     Assessment:      Pt was fit with right Breg Slingshot2 to provide immobilization, stability, support, and protection and promote healing for their shoulder surgery per Eyvonne Mechanic, *. Pt given written instructions on use and care by PACU.  All contact points of the Ox were checked and fit appropriately by OR staff. Pt was discharged from Sx.  Pt was educated how to perform a skin check and understood. Integrity of device was evaluated and confirmed upon delivery of Ox.      Plan:      All questions of the patient were answered. Patient will contact the referring MD for any questions regarding their chief complaint and recovery. Patient will contact O&P for any questions regarding their Ox. F/U PRN.

## 2021-08-22 NOTE — Anesthesia Procedure Notes (Signed)
Peripheral Nerve Block  Type(s): right interscalene     Start Time: 08/22/2021 7:00 AM     General Information and Staff  Patient location during procedure: pre-op  Performed: anesthesia resident   Anesthesiologist: Candise Che, MD  Resident/Fellow/CRNA: Letha Cape, MD  Service: Anesthesia    Pre-Block Checklist    Site verification witness:   Purpose of procedure: Purpose of procedure: post-op analgesia   Requesting Attending:  Anesthesia Attending:   Performed at surgeon's request        Consent:     Consent obtained:  Verbal    Consent given by:  Patient    Risks discussed:  Pain, ischemia, infection and bleeding    Consent Process:     Emergent situation preventing consent process:  No    Discussion with patient included the following (comment on exceptions):  Diagnosis (the reason for which the procedure is being proposed) and proposed treatment or procedure, Risks, benefits, side effects, likelihood of success of the proposed treatment, anticipated recuperation, and the alternative options and Patient's questions related to the procedure were addressed    Universal Protocol - Time Out Checklist:     Patient ID verified:  Yes    Surgical/procedural site and side verified:  Yes    Site marking verified:  Yes    Anesthesia Type: local and sedation  Preparation  Skin prep used: chlorhexidine gluconate     Technique  Single-shot  Nerve identification: ultrasound guided Ultrasound guidance was used for needle puncture at block site under direct visualization of the needle, and appropriate structures were identified.  A permanent image was obtained for the patients record.         -      -      -   Patient position: sitting    Needle  Type short-bevel, 22 G, length 2 in (5 cm),     Catheter        Block Events    No  Incremental injection/frequent aspiration and no apparent complications ()  End Time: 08/22/2021 7:05 AM  For full procedure information, see associated anesthesia  event/encounter.

## 2021-08-22 NOTE — Discharge Instructions (Signed)
[facility]    Post-operative shoulder surgery instructions      WHEN YOU GO HOME:    Ice: Use ice or a cold therapy unit over the shoulder for up to 20 minutes at a time every 1 to 2 hours to decrease pain and inflammation. Avoid applying ice directly on exposed skin. Use a cloth or thin towel under the ice to prevent direct contact with your skin.    Pain medication: You will be given prescriptions for a narcotic pain medication and non-steroidal anti-inflammatory drug (NSAID) for pain control. Begin taking your pain medications as soon as you begin to have pain. If you had a nerve block at the time of your surgery, begin taking pain medications before you go to sleep, or as soon as you begin to feel pain or feeling in the extremity. Do not wait until you are in severe pain, as it takes 30 to 45 minutes for the medications to take effect. Avoid combining the narcotic pain medication with separate Tylenol (acetaminophen). Tylenol is often included in narcotic pain medications and can be dangerous to your liver in high amounts. Please review the detailed medication instructions below.    Diet: After surgery, begin trying liquids and light foods (jello, soups, bread), then progress to your normal diet slowly over the next 24 hours. Some patients may experience nausea and constipation following surgery due to the anesthesia medications and narcotic pain medications. The use of a stool softener or mild laxative may be helpful for constipation.      Driving: Do not drive until instructed it is safe to do so.     Physical Therapy: You will begin formal physical therapy around 1-2 weeks following surgery unless instructed otherwise. Contact a local physical therapy provider to schedule a physical therapy visit after your first post-operative visit. A prescription for physical therapy was given to you in your preoperative packet--bring this with you to your first physical therapy  visit.      WOUND CARE/DRESSINGS/SLING:     Dressings: Keep the dressing in place over the surgical site for at least 3 days.   It is normal to experience some swelling and bruising in the arm, elbow, and wrist. It is helpful to keep the back elevated on 2-3 pillows so that the swelling decreases.    Bathing: You can shower starting 2 days after surgery. Replace the dressing with a new dry dressing.  Do not soak or submerge the incision until cleared by your surgeon; this includes sitting in a hot tub or swimming in a pool or ocean.    Sling: Continue to wear the sling until your first post-operative visit. You will wear the sling at least until your first postoperative clinic visit, depending on the type of shoulder surgery you had. Wear the sling at all times except bathing or when doing the prescribed post-operative exercises.  Do not lift any objects with your hand.       POST-OPERATIVE EXERCISES: START POST-OPERATIVE DAY 1 or 2:      Visit [email or web address] to review how to do these exercises          1. Scapular retraction: Begin this exercise by standing straight. Pinch your shoulder blades together and hold them for 10 seconds. Then relax and repeat the exercise again. Repeat this motion 30 times, three times a day. This exercise strengthens the muscles around your shoulder blade and allows your shoulder to move more effectively.  2. Newman Pies Squeezes: Begin this exercise by making a tight fist or by gripping a rubber ball. Hold this position for 10 seconds and then release. Repeat this exercise 20-30 times, three times a day. This exercise works your hand and helps prevent blood clots.    3. Elbow range of motion: Before starting this exercise, remove your sling. With the arm hanging at your side, slowly bend and straighten your elbow as far as tolerated using the other hand to assist. This exercise will help avoid stiffness in your elbow wearing a sling for long periods of time.    General Medication  Instructions After Surgery    You will receive multiple prescriptions before or at the time of surgery, typically including:    1. Narcotic Pain Medication (i.e Percocet - Oxycodone/Acetaminophen or Norco - Hydrocodone/Acetaminophen).  A narcotic pain medication is a strong pain medication that blocks the sensation of pain.  This is used in the early post-operative period to help recover from surgery comfortably.  Once your pain decreases, gradually wean off this medication as tolerated. Narcotic pain medications can be addictive long term. Common side effects include constipation, nausea, vomiting, itchiness, dry mouth, or feeling dizzy, sleepy or clumsy. Call your doctor or go to the hospital if you have any dangerous side effects such as difficulty waking up, severe confusion, breathing very slowly, passing out, seizures, or unable to urinate.  It is better to take this medication with food and drink plenty of water. Avoid additional Tylenol (acetaminophen). Tylenol is often included in narcotic pain medications and can be dangerous to your liver in high amounts. Use this medication as directed by the instructions provided below. Do not exceed the recommended dose or frequency.     2. Non-steroidal Antiinflammatory Drug/NSAID (i.e. Ibuprofen, Naproxen).  This is a mild pain medication that is also effective for decreasing inflammation and swelling after surgery.  This is typically taken for 2-3 weeks on a consistent basis after surgery. Use this medication as directed by the instructions provided below. NSAIDs can cause stomach upset, ulcers, and bleeding. It is better to take this medication with food. Speak with your doctor if you have a history of heartburn, ulcers, heart issues, stroke, kidney disease, or high blood pressure. Do not take Ibuprofen or other NSAIDs if you have an allergy to this medication.    3. Stool softener (i.e. Colace/Docusate).  A stool softener is often necessary while taking narcotic  medications to help with constipation.  Take this medication with a full glass of water.     4. Anti-nausea medication (i.e. Zofran, Compazine).  Anesthesia and narcotic pain medications can cause significant nausea in the first 24-48 hours after surgery.  Take the anti-nausea medication as needed during this time period to help with these symptoms.      RECOMMENDATIONS FOR PAIN CONTROL AFTER SURGERY    Many patients will receive a nerve block to help with pain control during and immediately after surgery. This temporarily causes numbness and difficulty moving the affected arm. Pain will gradually increase as the nerve block is wearing off. Pain is typically the worst during the first few days following surgery.     Starting after surgery and BEFORE your nerve block has completely worn off, use the following pain medicine regimen below for the first 24-48 hours:    - One or two narcotic pain medication pills (ie: Percocet or Norco) approximately every 4 hours. The day after surgery, some patients may need to take 2  pills every 4 hours for pain relief.    - In between narcotic pain medication doses, take 400-600mg  Ibuprofen. You can take up to 400mg  Ibuprofen every 6 hours, or 600mg  Ibuprofen every 8 hours.     - It is best to set an alarm, keep track of the medication schedule and have a responsible adult give you the medications so this routine can be followed through the first few days after surgery.    If you are taking two narcotic pain medication pills every 4 hours initially, you should be able to decrease the dosage to 1 pill every 4 hours after 48 hours or so.      - After 48 hours from surgery, and as pain decreases, gradually WEAN OFF the narcotic pain medication. Begin spreading out the time interval between narcotic pain medication doses as tolerated. As an example, take one narcotic pain medication pill every 6 hours, then 8 hours, then 12 hours, and then discontinue.     Continue taking the Ibuprofen,  generally 400-600 mg every 8 hours. This is to reduce swelling, inflammation and pain. Many patients come to their first post-operative appointment only taking ibuprofen or naproxen and have already weaned off the narcotic pain medication.    DO NOT TAKE ADDITIONAL TYLENOL (acetaminophen) or Tylenol containing products with the narcotic pain medication. Tylenol is often included in narcotic pain medication already and can be dangerous to your liver in high amounts     FOLLOW-UP CARE:   WHEN TO CALL THE OFFICE:     CALL [phone], #[phone], or your surgeons practice assistant during regular business hours 8 am-5 pm Monday to Friday     CALL [phone] # [phone] after hours, ask for the Sports Medicine Service  Call if any of the following are present:   * Unrelenting pain  * Fever or chills, fever of >101.5  * Excessive nausea/vomiting due to pain medication  * Continuous drainage or bleeding from the dressings  * Increasing swelling or persistent numbness or tingling in the hand/fingers  * Color change in the fingers. Your fingers should be pink and warm.   * Extreme pain on the back of the calf, shortness of breath, or chest pain which may be warning signs of a blood clot or pulmonary embolus.  If symptoms are severe, dial 9-1-1  * ANY other worrisome condition

## 2021-08-22 NOTE — Brief Op Note (Signed)
Brief Operative Note    Surgeon(s) and Role:     * Elly Morrie Sheldon, MD - Primary     * Corey Harold, MD - Fellow - Assisting    Date of Operation: 09-15-21    Pre-Op Diagnosis Codes:     * Displaced fracture of shaft of right clavicle, initial encounter for closed fracture [S42.021A]    Post-Op Diagnosis Codes:     * Displaced fracture of shaft of right clavicle, initial encounter for closed fracture [S42.021A]    Procedure(s) and Anesthesia Type:     * CLAVICLE OPEN REDUCTION INTERNAL FIXATION (ORIF) - Anes-General    Implants:   Implant Name Type Inv. Item Serial No. Manufacturer Lot No. LRB No. Used Action   PLATE CLAVICLE 8HL NARROW PROFILE LRG RT   70-0301 - [employer] PLATE CLAVICLE 8HL NARROW PROFILE LRG RT   70-0301 NA  NA Right 1 Implanted   SCREW CORT 3.5X12MM NON-LOCK HEXALOBE   30-0257 - [employer] SCREW CORT 3.5X12MM NON-LOCK HEXALOBE   [id number] NA  NA Right 3 Implanted   SCREW CORT 3.5X14MM NON-LOCK HEXALOBE   30-0258 - [employer] SCREW CORT 3.5X14MM NON-LOCK HEXALOBE   [id number] NA  NA Right 2 Implanted   SCREW CORT 3.5X14MM LOCKING HEXALOBE   [id number] - [employer] SCREW CORT 3.5X14MM LOCKING HEXALOBE   [id number] NA  NA Right 1 Implanted        * No specimens in log *                       Estimated Blood Loss: 20 mL

## 2021-08-22 NOTE — Anesthesia Post-Procedure Evaluation (Addendum)
Anesthesia Post-op Evaluation    Scheduled date of Operation: 08/22/2021    Scheduled Surgeon(s):Elly Morrie Sheldon, MDSamuel Lulu Riding, MD  Scheduled Procedure(s):CLAVICLE OPEN REDUCTION INTERNAL FIXATION (ORIF)    Final Anesthesia Type: general, regional    Assessment  Respiratory Function:      Airway Patency: Excellent      Respiratory Rate: See vitals below      SpO2: See vitals below      Overall Respiratory Assessment: Stable  Cardiovascular Function:      Pulse Rate: See Vitals Below      Blood Pressure: See Vitals Below      Cardiac status: Stable  Mental Status:      RASS Score: 0 Alert or calm  Temperature: Normothermic  Pain Control: Adequate  Nausea and Vomiting: Absent  Fluids/Hydration Status: Euvolemic    Complications (anesthesia/case associated complications, possible complications, and/or significant issues; as of time of note completion: No apparent complications      Plan  Follow-up care: As per primary team    Post-op Note Status: Complete, patient participated in evaluation, which occurred after recovery from anesthesia but prior to 48 hours from end of case           Post-op Day 1 Follow Up:  Patient discharged, telephone/electronic follow up. No new findings.  The patient was called/seen at: 08/23/2021 10:00 AM  Findings: No new findings  Highest pain score in first 24 hours since receiving the nerve block (0 = no pain, 10 = worst, imaginable pain): 8  Time nerve block stop providing pain relief: 08/22/2021 6:00 PM  Block Performed: Post-Single-Shot Nerve Block(s)    POST-Single-Shot Nerve Block  Patient reports a 8/10 satisfaction with their regional anesthetic.  Would get a nerve block again? Yes  Follow-up: No further follow-up needed           Recent Pre-op and Post-op Vital Signs  Vitals:    08/22/21 1050 08/22/21 1100 08/22/21 1105   BP:  (!) 154/82 (!) 157/83   Pulse: 77 80 80   Resp: (!) 21 (!) 22 (!) 22   Temp:   36.8 C (98.2 F)   TempSrc:      SpO2: 96% 96% 96%     Last Vital Signs  Out of Room to Anesthesia Stop  Vitals Value Taken Time   Pulse 76 08/22/21 0951   Resp 11 08/22/21 0951   SpO2 100 % 08/22/21 0951   BP 147/84 08/22/21 0950   Arterial Line BP (mmHg)      Arterial Line MAP (mmHg)     Arterial Line 2 BP (mmHg)     Arterial Line 2 MAP (mmHg)     Temp 36.2 C (97.2 F) 08/22/21 0945   Temp src Temporal 08/22/21 0945   Vitals shown include unvalidated device data.    Case Tracking Events:  Event Time In   In Facility 0542   In Pre-op 0600   Anesthesia Start 0728   Anesthesia Ready 0803   In Room 0728   Airway-Start 0737   Procedure Start 0811   Procedure Finish 0942   Extubation 0942   Out of Room 0944   Anesthesia Finish [phone]   In PACU 0945   Ready for Discharge 1115   Patient Out of PACU 1123

## 2021-08-22 NOTE — Op Note (Signed)
[facility]                                                  Operative Note    Date of Procedure: 08/22/2021     Pre-operative Diagnosis: right clavicle fracture    Post-operative Diagnosis:  Same    Procedures performed: Open reduction internal fixation of right clavicle fracture    Attending: Eyvonne Mechanic, MD    Assistants: Corey Harold, MD    Specimens:  none    Drains:  none    Complications:  none    Implants:  Acumed superior plate with 6 compression screws    Indications for Procedure:  Alvin Barber is a 65 y.o. RHD male with a history of trauma and clavicle fracture that was displaced.    The risks, benefits, and alternatives were explained to the patient.  They understand that the risks include, but are not limited to anesthesia complications.  Surgical risks include bleeding, infection, pain, scar, need for re-operation, recurrence, implant failure, damage to surrounding structures including nerves and vessels, DVT, PE.  They understand that there is a risk of loss of life, loss of limb, and loss of function.  No guarantees were given or implied. They consented to proceed.    Findings:   The clavicle was found to be fractured mid shaft, oblique fracture pattern with mild comminution and inf/ant small butterfly fragment. Fracture was shortened and rotated upon entry.    Procedure in Detail:    The patient was identified in the preoperative area and the surgical site was marked.  Nerve block was placed in the preoperative holding area. The patient was taken to the operating room and positioned appropriately on the operating room table.  Anesthesia was established and the shoulder was prepped and draped in the usual sterile fashion.  A surgical time out was performed confirming patient identification, surgical site, procedure, and allergies.  Intravenous antibiotics were administered.   The patient was positioned in the [place] chair position. A superior/anterior incision was  marked and infiltrated with local anesthesia, and the incision was made using a #15 blade.  We carefully dissected through the overlying platysma muscle and fascia, and elevated it off the fracture site.  We used small curettes and a rongeur to clean out the fracture site for better reduction. The fracture was identified and we were able to reduce it well with bone clamps.  We checked with C arm imaging to make sure the clavicle was out to length and the fracture was reduced. A superior plate was used which was initially secured with plate tacks, and could have 3 screws on either side of the fracture, and we drilled and filled screws, checking with the depth gauge as we went along.   the small butterfly fragment was tucked in. No lag screw was possible as the inferior oblique fragment was then and the lag screw would have been in the way of the plate for compression. Biplanar fluoroscopy performed intraoperatively show good reduction of the fracture itself.    Following fixation, we took the arm out to have range of motion and confirmed that the fracture is stable.  We irrigated the wound very well.  The platysma and fascia was closed with interrupted sutures.  The skin was closed using 0-vicryl, #3-0 vicryl, and a monocryl subcuticular stitch. A  sterile dressing was applied followed by a sling.    The patient was awakened and transferred to the recovery room in stable condition.  At the end of the case, sponge, instrument, and needle counts were correct.  I participated in all aspects of the case.    If the assistant surgeon is other than a qualified resident, I certify that the services were medically necessary and there was no qualified resident available to perform the services.

## 2021-08-22 NOTE — Anesthesia Procedure Notes (Signed)
Airway  Date/Time: 08/22/2021 7:37 AM  Urgency: elective    Difficult airway: no    Final Airway Details  Final airway type: supraglottic airway      Successful airway: LMA - Unique  Size 5    Number of attempts at approach: 1  Number of other approaches attempted: 0    General Information and Staff  Patient location during procedure: OR  Performed: anesthesia attending   Anesthesiologist: Bearl Mulberry, MD    Indications and Patient Condition  Indications for airway management: anesthesia  Spontaneous Ventilation: absent  Sedation level: anesthesia  Preoxygenated: yes  Patient position: neck neutral  MILS not maintained throughout  Mask difficulty assessment: 0 - not attempted      For full procedure information, see associated anesthesia event/encounter.

## 2021-09-06 ENCOUNTER — Ambulatory Visit: Admit: 2021-09-06 | Payer: BLUE CROSS/BLUE SHIELD | Attending: Physician

## 2021-09-06 ENCOUNTER — Inpatient Hospital Stay: Admit: 2021-09-06 | Payer: BLUE CROSS/BLUE SHIELD

## 2021-09-06 DIAGNOSIS — S42021D Displaced fracture of shaft of right clavicle, subsequent encounter for fracture with routine healing: Secondary | ICD-10-CM

## 2021-09-06 DIAGNOSIS — M7502 Adhesive capsulitis of left shoulder: Secondary | ICD-10-CM

## 2021-09-06 DIAGNOSIS — M898X1 Other specified disorders of bone, shoulder: Secondary | ICD-10-CM

## 2021-09-06 NOTE — Progress Notes (Signed)
SUBJECTIVE  The patient is status post right clavicle ORIF on 08/22/21.  He is doing fairly well. He reports significant pain over the first week after surgery, which improved significantly with oxycodone.     He has since d/c oxycodone and has been managing pain with Advil. Physical therapy starts 09/21/21 in [facility], as he has had difficulty scheduling any visits sooner.    Of note, he also reports a history of left shoulder pain and surgery, and would like some home exercises that address his left side as well.   Has frozen shoulder and h/o complex clavicle surgery.    SHOULDER EXAM    Side: right    The incision is clean, dry, intact, and healing well.  Minimal numbness.    Shoulder Range of Motion    Range of motion on the affected side deferred secondary to pain/injury.    Strength (out of 5)    Strength testing deferred today       Distally the patient's neurovascular status is normal    IMAGING    Post operative imaging shows the hardware to be intact and the reduction is maintained.    Assessment & Plan:     Alvin Barber is doing well 2 weeks after ORIF of the clavicle. We reviewed the intraoperative and post operative images which show an appropriate reduction.      - We will start physical therapy to work on passive and active motion, but continue non-weight bearing. His first session is 09/21/21 and he will work on gentle home exercises in the meantime. Under physician guidance, the athletic trainers instructed the patient on therapeutic exercises. Gave separate script for L shoulder.  - The sling will be continued as well for at least 2 more weeks.   - May try light recumbent bike.  - I can see him for future evaluation of the left shoulder, if pain persists. Would get new xrays and consider hwr removal.    Follow up in 6 weeks time point with repeat imaging clavicle.    Alvin Barber am acting as a scribe for services provided by Eyvonne Mechanic, MD on 09/06/21  10:47 AM.  The above scribed  documentation as annotated by me accurately reflects the services I have provided.   Cheri Guppy, MD

## 2021-10-25 ENCOUNTER — Inpatient Hospital Stay: Admit: 2021-10-25 | Payer: BLUE CROSS/BLUE SHIELD

## 2021-10-25 ENCOUNTER — Ambulatory Visit: Admit: 2021-10-25 | Payer: BLUE CROSS/BLUE SHIELD | Attending: Physician

## 2021-10-25 DIAGNOSIS — M898X1 Other specified disorders of bone, shoulder: Secondary | ICD-10-CM

## 2021-10-25 DIAGNOSIS — S42021D Displaced fracture of shaft of right clavicle, subsequent encounter for fracture with routine healing: Secondary | ICD-10-CM

## 2021-10-25 NOTE — Progress Notes (Signed)
SUBJECTIVE  The patient is status post right clavicle ORIF on 08/22/21.    Since last visit, he developed a likely suture abscess with associated pain, but no concern for infection. Otherwise, he is doing very well. He has little to no pain and is able to sleep on his right side without discomfort. His numbness has improved as well.    Has d/c PT at [facility] and was very happy with progress. He has been doing home exercises with resistance bands. He has returned to [facility], leaning on his R shoulder, but has refrained from anything strenuous such as push-ups.    Of note, he also reports a history of left shoulder pain and surgery and has continued exercises on that side with noticeable improvements in pain. He would like to defer [facility] at this point.    Has frozen shoulder and h/o complex clavicle surgery on the L.    SHOULDER EXAM    Side: right    The incision is clean, dry, intact, and healing well.  Resolved numbness.    Shoulder Range of Motion  FF: 170  Abd: deferred  ER: 70  IR: L1    Strength (out of 5)   5/5 with gentle RTC testing ER, IR FF    IMAGING  I independently interpreted the following test:   right clavicle x-ray images taken today:  Imaging shows the hardware to be intact and the reduction is maintained. No evidence of loosening or movement of screws. Fracture line is still visible with interval healing.    Assessment & Plan:     Alvin Barber is doing well 2 weeks after ORIF of the clavicle.     - Continue home exercises with resistance bands for both shoulders.  - Avoid heavy lifting with excessive loads through the shoulder and lifting with arms away from bodyor activities that have fall risk.  - If left shoulder pain begins worsening again, can see him for future evaluation. Would get new xrays and consider hwr removal.  - He will obtain x-rays in 6 weeks at [facility] clinic.    Follow up: Telehealth visit in 6 weeks (after obtaining x-rays) for imaging review    I, Coleman Fine am acting as a  scribe for services provided by Eyvonne Mechanic, MD on 10/25/21  8:21 AM.  The above scribed documentation as annotated by me accurately reflects the services I have provided.   Cheri Guppy, MD

## 2023-04-23 MED ADMIN — 0.9 % sodium chloride infusion: 1000 mL | INTRAVENOUS | NDC 00338004904

## 2023-04-24 MED ADMIN — propofoL (DIPRIVAN) 10 mg/mL infusion: 30 | INTRAVENOUS | @ 18:00:00 | NDC 63323026965

## 2023-04-24 MED ADMIN — propofoL (DIPRIVAN) 10 mg/mL infusion: 100 | INTRAVENOUS | @ 18:00:00 | NDC 63323026965

## 2023-04-24 MED ADMIN — iohexoL (OMNIPAQUE) 350 mg iodine/mL solution 100 mL: 100 mL | INTRAVENOUS | @ 02:00:00 | NDC 00407141491

## 2023-04-24 MED ADMIN — apixaban (ELIQUIS) tablet 5 mg: 5 mg | ORAL | @ 15:00:00 | NDC 00003089431

## 2023-04-24 MED ADMIN — insulin glargine (LANTUS, BASAGLAR) injection 100 units/mL pen: 18 [IU] | SUBCUTANEOUS | @ 04:00:00 | NDC 00088221901

## 2023-04-24 MED ADMIN — 0.9% sodium chloride flush injection syringe 10 mL: 10 mL | INTRAVENOUS | @ 03:00:00

## 2023-04-24 MED ADMIN — famotidine (PEPCID) tablet 40 mg: 40 mg | ORAL | @ 15:00:00 | NDC 63739064510

## 2023-04-24 MED ADMIN — famotidine (PEPCID) tablet 40 mg: 40 mg | ORAL | @ 04:00:00 | NDC 63739064510

## 2023-04-24 MED ADMIN — 0.9 % sodium chloride infusion: 10 mL/h | INTRAVENOUS | @ 18:00:00 | NDC 00338004904

## 2023-04-24 MED ADMIN — 0.9% sodium chloride flush injection syringe 10 mL: 10 mL | INTRAVENOUS | @ 15:00:00

## 2023-04-24 MED ADMIN — apixaban (ELIQUIS) tablet 5 mg: 5 mg | ORAL | @ 03:00:00 | NDC 00003089431

## 2023-04-24 MED FILL — NONFORMULARY REQUEST: Qty: 1

## 2023-04-24 MED FILL — MIRALAX 17 GRAM ORAL POWDER PACKET: 17 gram | ORAL | Qty: 1

## 2023-04-24 MED FILL — MAGNESIUM SULFATE 2 GRAM/50 ML (4 %) IN WATER INTRAVENOUS PIGGYBACK: 2 gram/50 mL (4 %) | INTRAVENOUS | Qty: 150

## 2023-04-24 MED FILL — NITROGLYCERIN 0.4 MG SUBLINGUAL TABLET: 0.4 mg | SUBLINGUAL | Qty: 25

## 2023-04-24 MED FILL — ELIQUIS 5 MG TABLET: 5 mg | ORAL | Qty: 1

## 2023-08-18 MED ADMIN — ePHEDrine injection: 10 | INTRAVENOUS | @ 16:00:00 | NDC 00781326971

## 2023-08-18 MED ADMIN — propofoL (DIPRIVAN) 10 mg/mL infusion: 200 | INTRAVENOUS | @ 15:00:00 | NDC 63323026922

## 2023-08-18 MED ADMIN — lidocaine (PF) (XYLOCAINE) 100 mg/5 mL (2 %) injection syringe: 100 | INTRAVENOUS | @ 15:00:00 | NDC 76329339001

## 2023-08-18 MED ADMIN — haloperidol lactate (HALDOL) injection: 1 | INTRAVENOUS | @ 16:00:00 | NDC 63323047400

## 2023-08-18 MED ADMIN — lidocaine (XYLOCAINE) 10 mg/mL (1 %) injection: 5 | @ 15:00:00 | NDC 00409427616

## 2023-08-18 MED ADMIN — rocuronium injection: 20 | INTRAVENOUS | @ 16:00:00 | NDC 63323042602

## 2023-08-18 MED ADMIN — lactated ringers infusion: 10 mL | INTRAVENOUS | @ 14:00:00 | NDC 00338011703

## 2023-08-18 MED ADMIN — fentaNYL (PF) (SUBLIMAZE) injection: 100 | INTRAVENOUS | @ 15:00:00 | NDC 00409909425

## 2023-08-18 MED ADMIN — famotidine (PEPCID) injection: 20 | INTRAVENOUS | @ 15:00:00 | NDC 63323073911

## 2023-08-18 MED ADMIN — heparin 1,000 unit/mL injection: 2500 | INTRAVENOUS | @ 16:00:00 | NDC 63323054005

## 2023-08-18 MED ADMIN — heparin in 0.9 % sodium chloride 2,000 units/1,000 mL (2 units/mL) infusion: 100 | @ 16:00:00 | NDC 00409762049

## 2023-08-18 MED ADMIN — aspirin tablet 325 mg: 325 mg | ORAL | @ 14:00:00 | NDC 57896090101

## 2023-08-18 MED ADMIN — ondansetron (ZOFRAN) injection: 4 | INTRAVENOUS | @ 16:00:00 | NDC 00641607801

## 2023-08-18 MED ADMIN — propofoL (DIPRIVAN) 10 mg/mL infusion: 60 | INTRAVENOUS | @ 15:00:00 | NDC 63323026922

## 2023-08-18 MED ADMIN — rocuronium injection: 50 | INTRAVENOUS | @ 15:00:00 | NDC 63323042602

## 2023-08-18 MED ADMIN — ceFAZolin (ANCEF) injection: 2000 | INTRAVENOUS | @ 15:00:00 | NDC 00143992490

## 2023-08-18 MED ADMIN — heparin 1,000 unit/mL injection: 8500 | INTRAVENOUS | @ 16:00:00 | NDC 63323054005

## 2023-08-18 MED ADMIN — iohexoL (OMNIPAQUE) 350 mg iodine/mL solution: 50 | INTRAVENOUS | @ 16:00:00 | NDC 00407141493

## 2023-08-18 MED ADMIN — sugammadex (BRIDION) injection: 200 | INTRAVENOUS | @ 16:00:00 | NDC 00006542302

## 2023-08-18 MED ADMIN — protamine injection: 5000 | INTRAVENOUS | @ 16:00:00 | NDC 63323022905

## 2023-08-18 MED ADMIN — dexamethasone (DECADRON) injection: 8 | INTRAVENOUS | @ 15:00:00 | NDC 67457042200

## 2023-08-18 MED FILL — PHENYLEPHRINE 20 MG/250 ML (80 MCG/ML) IN 0.9 % SODIUM CHLORIDE IV: 20 mg/250 mL (80 mcg/mL) | INTRAVENOUS | Qty: 250

## 2023-08-18 MED FILL — ONDANSETRON HCL (PF) 4 MG/2 ML INJECTION SOLUTION: 4 mg/2 mL | INTRAMUSCULAR | Qty: 2

## 2023-08-18 MED FILL — ASPIRIN 325 MG TABLET: 325 mg | ORAL | Qty: 1

## 2023-08-18 MED FILL — EPHEDRINE (PF) 50 MG/5 ML (10 MG/ML) IN 0.9% SOD. CHLORIDE IV SYRINGE: 50 mg/5 mL (10 mg/mL) | INTRAVENOUS | Qty: 5

## 2023-08-18 MED FILL — BRIDION 100 MG/ML INTRAVENOUS SOLUTION: 100 mg/mL | INTRAVENOUS | Qty: 2

## 2023-08-18 MED FILL — FAMOTIDINE (PF) 20 MG/2 ML INTRAVENOUS SOLUTION: 20 mg/2 mL | INTRAVENOUS | Qty: 2

## 2023-08-18 MED FILL — HALOPERIDOL LACTATE 5 MG/ML INJECTION SOLUTION: 5 mg/mL | INTRAMUSCULAR | Qty: 1

## 2024-01-24 IMAGING — MR MRI CERVICAL SPINE WITHOUT CONTRAST
7 of 12 series · 10 of 48 positions shown · IV contrast (gadolinium)
Comparison: None

________________________________________________________________________________________________ 
MRI CERVICAL SPINE WITHOUT CONTRAST, 01/24/2024 [DATE]: 
CLINICAL INDICATION: Radiculopathy, cervical region , neck pain that radiates 
into the right arm.
TECHNIQUE: Multiplanar, multiecho position MR images of the cervical spine were 
performed without intravenous gadolinium enhancement.

[Series 301: survey · axial · 10.0mm · 1.25mm/px · 1 of 10 slices shown]
[im 1/10]
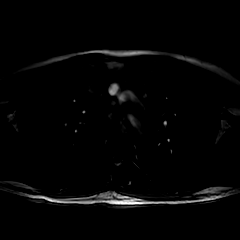

[Series 401: t2w_cor-surv · coronal · 5.0mm · 0.69mm/px · 1 of 7 slices shown]
[im 1/7]
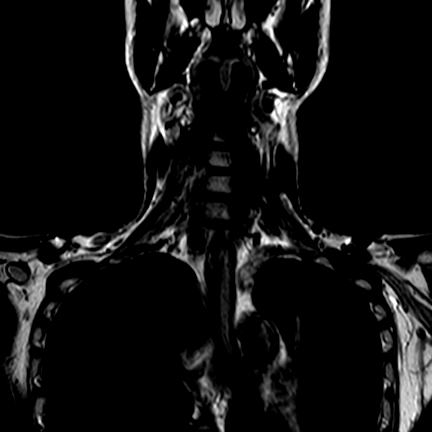

[Series 501: T1 · sagittal · 3.0mm · 0.39mm/px · 1 of 15 slices shown]
[im 1/15]
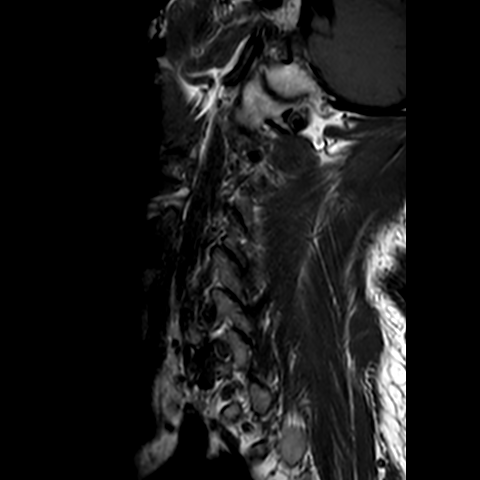

[Series 602: (id)_mdixon_tse · sagittal · 3.0mm · 0.35mm/px · 1 of 15 slices shown]
[im 1/15]
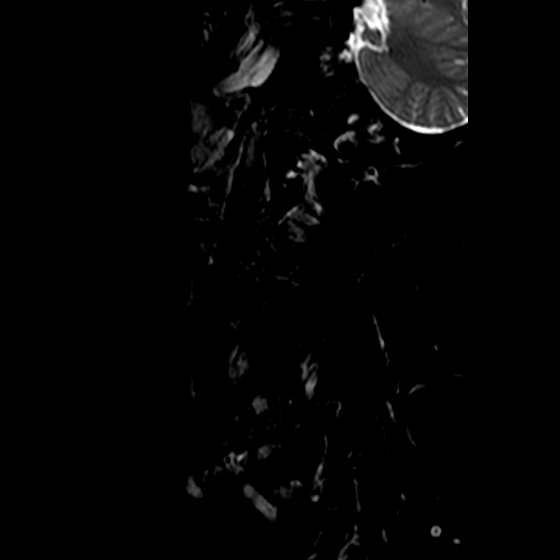

[Series 603: st2w_mdixon_tse · sagittal · 3.0mm · 0.35mm/px · 1 of 15 slices shown]
[im 1/15]
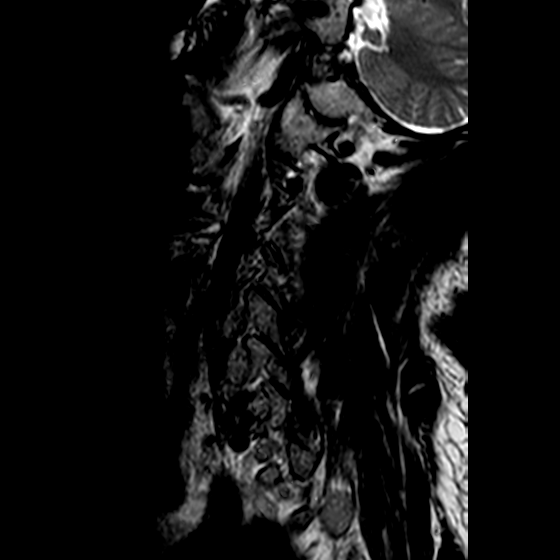

[Series 704: csp oblq right · oblique · 1.0mm · 0.15mm/px · 3 of 36 slices shown]
[im 1/36]
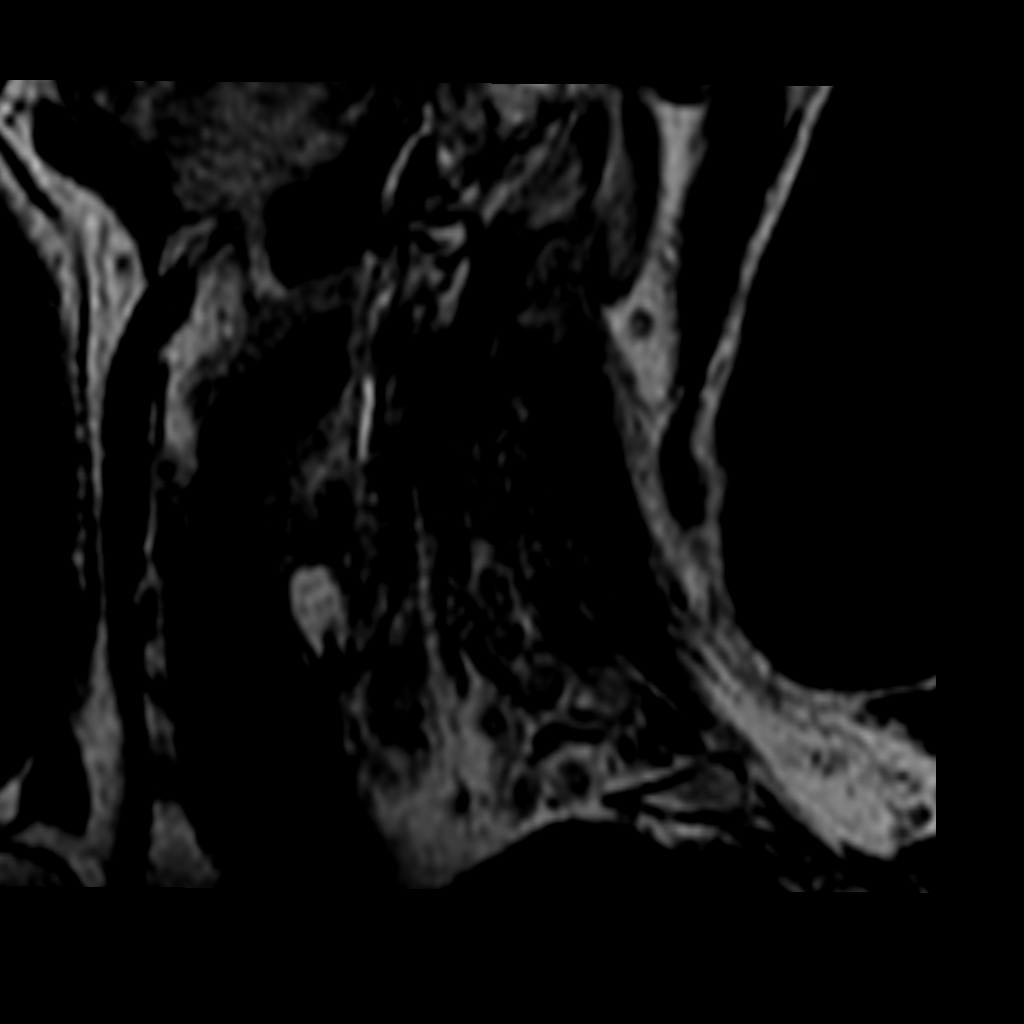
[im 18/36]
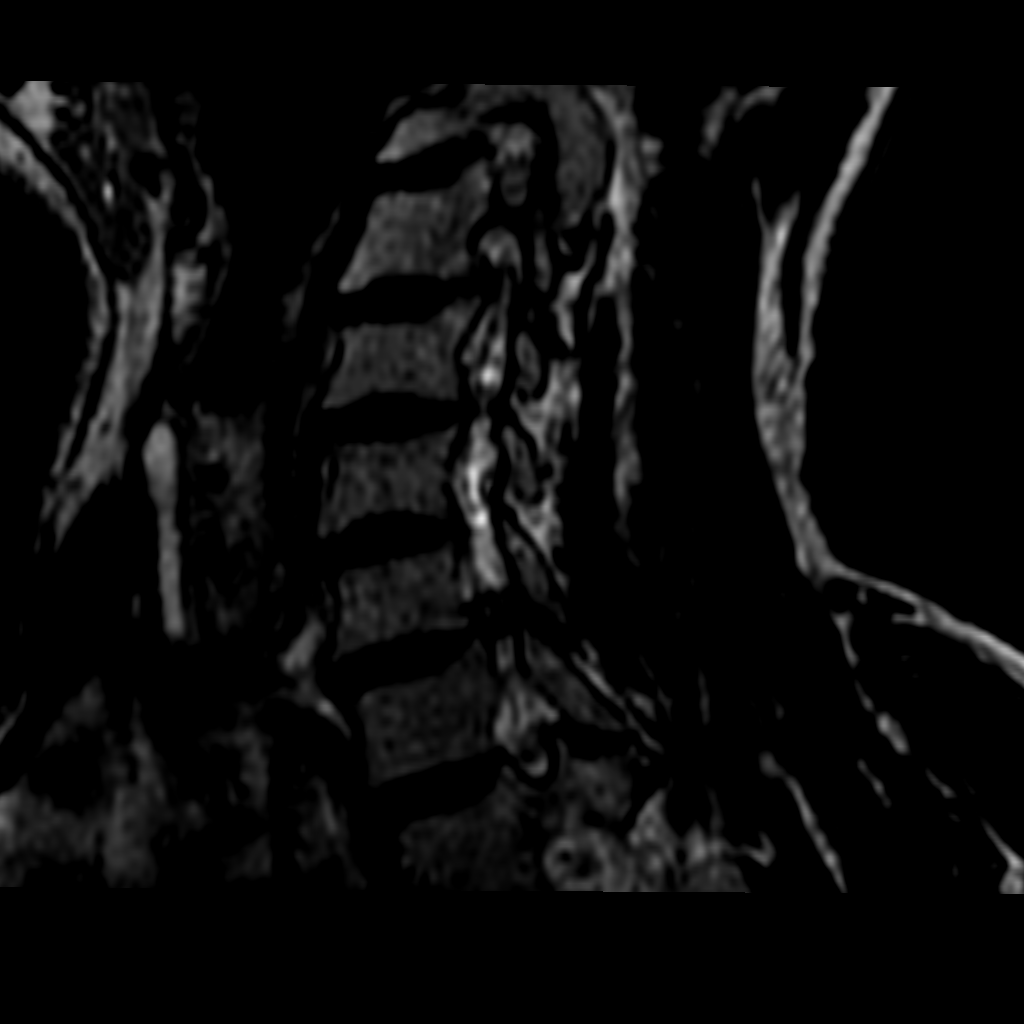
[im 36/36]
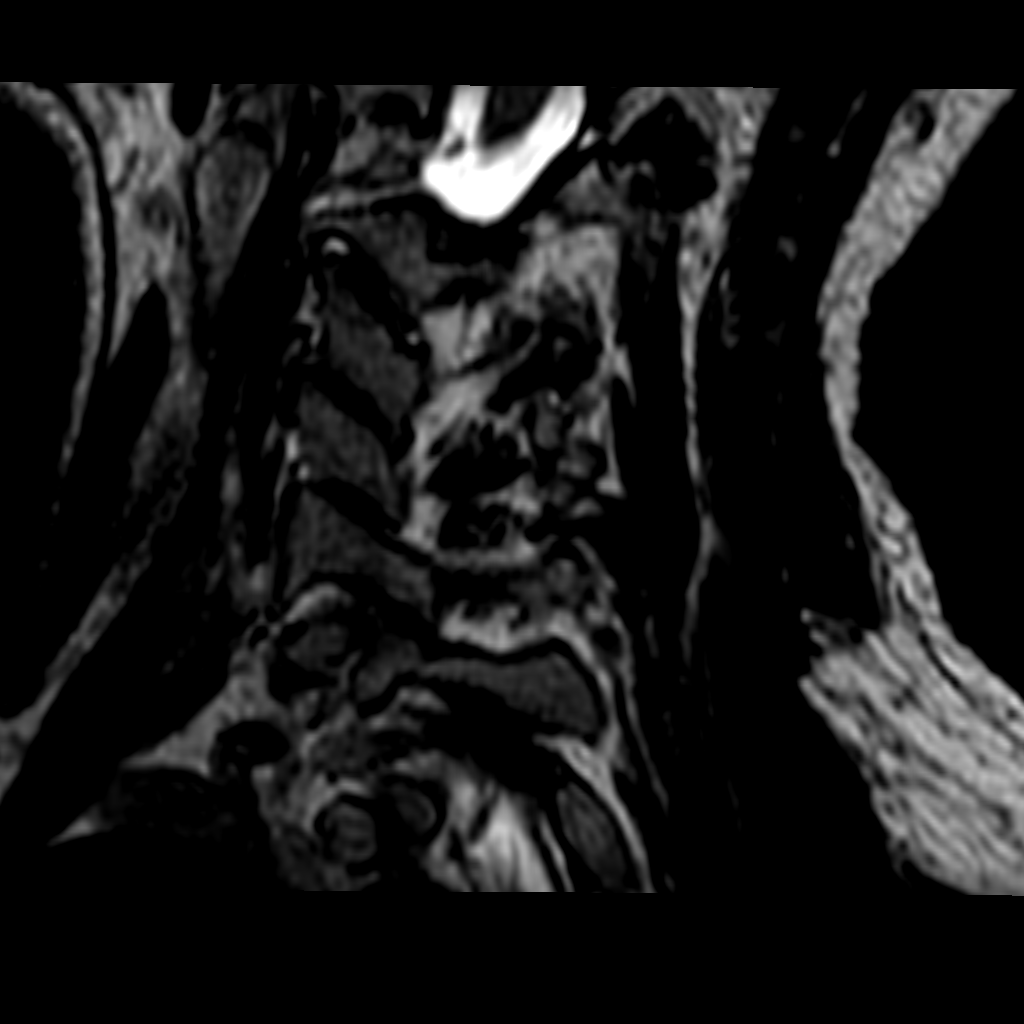

[Series 801: T2 · oblique · 3.0mm · 0.29mm/px · 2 of 18 slices shown]
[im 1/18]
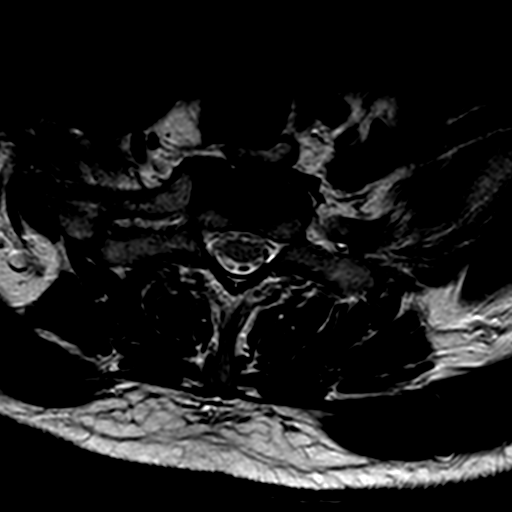
[im 18/18]
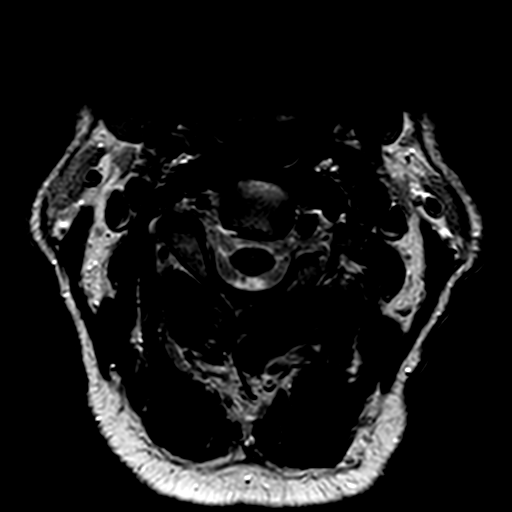

[10 of 48 positions shown; findings below may reference images not displayed]

FINDINGS: -------------------------------------------------------------------------------- 
----------------- 
GENERAL: 
ALIGNMENT: Normal coronal alignment. Grade 1 anterolisthesis C3 on C4. 
VERTEBRAL BODY HEIGHT: Normal.  
MARROW SIGNAL: No focal suspect signal abnormality. 
CORD SIGNAL: Normal.  
ADDITIONAL FINDINGS: Tiny 2 to 3 mm mucous retention cyst versus adherent mucus 
along the inferior tip of the uvula. 
-------------------------------------------------------------------------------- 
---------------- 
SEGMENTAL: 
CRANIOCERVICAL JUNCTION: No significant stenosis. 
C2-C3: Normal disc height. No herniation. Normal facets. No spinal canal or 
neural foraminal stenosis. 
C3-C4: Normal disc height. Canal patent. Minimal disc osteophyte complex. 
Bilateral uncinate spurring with patent foramina. 
C4-C5: Normal disc height with mild canal stenosis and ventral cord flattening. 
Moderate right foraminal narrowing. Left foramen patent. Correlate for right C5 
nerve root involvement. 
C5-C6: Mild loss of disc height with slight ventral cord flattening and 
borderline canal stenosis. Mild to moderate right and mild left foraminal 
narrowing. 
C6-C7: Normal disc height with minimal disc osteophyte complex. There is right 
foraminal disc osteophyte extrusion with moderate to severe right foraminal 
narrowing. Correlate for right C7 nerve root involvement. Left foramen patent. 
C7-T1: Normal disc height. No herniation. Normal facets. No spinal canal or 
neural foraminal stenosis. 
-------------------------------------------------------------------------------- 
---------------
IMPRESSION: C6-C7, right foraminal disc osteophyte extrusion with moderate to severe right 
foraminal narrowing, may be affecting exiting right C7 nerve root. 
C4-C5, moderate right foraminal narrowing could affect the exiting right C5 
nerve root. 
C5-C6, mild-to-moderate right foraminal narrowing. 
Less significant degenerative changes detailed above.
